# Patient Record
Sex: Male | Born: 1985 | Race: White | Hispanic: No | Marital: Single | Smoking: Former smoker
Health system: Southern US, Community
[De-identification: ages and names within clinical notes are randomized; demographics above are authoritative.]

## PROBLEM LIST (undated history)

## (undated) DIAGNOSIS — M5020 Other cervical disc displacement, unspecified cervical region: Secondary | ICD-10-CM

## (undated) DIAGNOSIS — I10 Essential (primary) hypertension: Secondary | ICD-10-CM

## (undated) DIAGNOSIS — IMO0001 Reserved for inherently not codable concepts without codable children: Secondary | ICD-10-CM

## (undated) DIAGNOSIS — G2581 Restless legs syndrome: Secondary | ICD-10-CM

---
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
## 2005-10-22 ENCOUNTER — Emergency Department (HOSPITAL_COMMUNITY): Admission: EM | Admit: 2005-10-22 | Discharge: 2005-10-22 | Payer: Self-pay | Admitting: Emergency Medicine

## 2012-10-09 ENCOUNTER — Emergency Department (HOSPITAL_COMMUNITY)
Admission: EM | Admit: 2012-10-09 | Discharge: 2012-10-09 | Disposition: A | Discharge status: Home / Self Care | Payer: Worker's Compensation | Attending: Emergency Medicine | Admitting: Emergency Medicine

## 2012-10-09 ENCOUNTER — Encounter (HOSPITAL_COMMUNITY): Payer: Self-pay | Admitting: Nurse Practitioner

## 2012-10-09 DIAGNOSIS — R5381 Other malaise: Secondary | ICD-10-CM | POA: Insufficient documentation

## 2012-10-09 DIAGNOSIS — Y929 Unspecified place or not applicable: Secondary | ICD-10-CM | POA: Insufficient documentation

## 2012-10-09 DIAGNOSIS — R209 Unspecified disturbances of skin sensation: Secondary | ICD-10-CM | POA: Insufficient documentation

## 2012-10-09 DIAGNOSIS — Y9389 Activity, other specified: Secondary | ICD-10-CM | POA: Insufficient documentation

## 2012-10-09 DIAGNOSIS — F17200 Nicotine dependence, unspecified, uncomplicated: Secondary | ICD-10-CM | POA: Insufficient documentation

## 2012-10-09 DIAGNOSIS — S139XXA Sprain of joints and ligaments of unspecified parts of neck, initial encounter: Secondary | ICD-10-CM | POA: Insufficient documentation

## 2012-10-09 DIAGNOSIS — S161XXA Strain of muscle, fascia and tendon at neck level, initial encounter: Secondary | ICD-10-CM

## 2012-10-09 DIAGNOSIS — R5383 Other fatigue: Secondary | ICD-10-CM | POA: Insufficient documentation

## 2012-10-09 DIAGNOSIS — X500XXA Overexertion from strenuous movement or load, initial encounter: Secondary | ICD-10-CM | POA: Insufficient documentation

## 2012-10-09 DIAGNOSIS — IMO0002 Reserved for concepts with insufficient information to code with codable children: Secondary | ICD-10-CM | POA: Insufficient documentation

## 2012-10-09 DIAGNOSIS — S46911A Strain of unspecified muscle, fascia and tendon at shoulder and upper arm level, right arm, initial encounter: Secondary | ICD-10-CM

## 2012-10-09 MED ORDER — IBUPROFEN 800 MG PO TABS: 800.0000 mg | ORAL_TABLET | Freq: Three times a day (TID) | ORAL | Status: DC

## 2012-10-09 NOTE — ED Notes (Signed)
Pt was carrying a ladder and ladder started to fall and twisted his R shoulder trying to hold onto it. C/O r sided neck pain radiating down R arm and R side since. Ambulatory, mae, cms intact

## 2012-10-09 NOTE — ED Notes (Signed)
Pt states that he knows his BP is high. States "i smoke, drink and eat bad"

## 2012-10-09 NOTE — ED Provider Notes (Signed)
Medical screening examination/treatment/procedure(s) were performed by non-physician practitioner and as supervising physician I was immediately available for consultation/collaboration.  Enid Skeens, MD 10/09/12 2146

## 2012-10-09 NOTE — ED Provider Notes (Signed)
History    CSN: [id number] Arrival date & time 10/09/12  1252  First MD Initiated Contact with Patient 10/09/12 1310     Chief Complaint  Patient presents with  . Shoulder Injury   (Consider location/radiation/quality/duration/timing/severity/associated sxs/prior Treatment) HPI Pt is a 27yo male presenting today with acute right sided neck pain and right shoulder pain.  Pt states he was carrying a ladder and started to fall, on his way down, the ladder twisted his right shoulder as he tried to hold onto it.  Pt states immediately after incident, experienced several minutes for burning sensation in right side of shoulder and right shoulder.  Now pain is dull ache, 4/10, feels "heavy" and worse with rotation of head to right. Associated mild numbness in right hand.  Denies hitting head, LOC, open wounds.  Denies bruising or erythema.   History reviewed. No pertinent past medical history. History reviewed. No pertinent past surgical history. History reviewed. No pertinent family history. History  Substance Use Topics  . Smoking status: Current Every Day Smoker    Types: Cigarettes  . Smokeless tobacco: Not on file  . Alcohol Use: Yes    Review of Systems  Constitutional: Negative for fever and chills.  HENT: Positive for neck pain. Negative for neck stiffness.   Musculoskeletal: Positive for myalgias and arthralgias.  Neurological: Positive for weakness ( right shoulder) and numbness ( right hand). Negative for dizziness, light-headedness and headaches.  All other systems reviewed and are negative.    Allergies  Review of patient's allergies indicates no known allergies.  Home Medications   Current Outpatient Rx  Name  Route  Sig  Dispense  Refill  . ibuprofen (ADVIL,MOTRIN) 800 MG tablet   Oral   Take 1 tablet (800 mg total) by mouth 3 (three) times daily.   21 tablet   0    BP 156/96  Pulse 87  Temp(Src) 98.3 F (36.8 C) (Oral)  Resp 16  Ht 5\' 9"  (1.753 m)  Wt  230 lb (104.327 kg)  BMI 33.95 kg/m2  SpO2 99% Physical Exam  Nursing note and vitals reviewed. Constitutional: He appears well-developed and well-nourished.  Pt sitting comfortably in exam bed, NAD.     HENT:  Head: Normocephalic and atraumatic.  Eyes: Conjunctivae are normal. No scleral icterus.  Neck: Normal range of motion. Neck supple.  No midline bone tenderness, no crepitus or step-offs.  TTP in right paraspinal muscle of cervical spine. FROM, pain with rotation to right    Cardiovascular: Normal rate, regular rhythm and normal heart sounds.   Pulmonary/Chest: Effort normal and breath sounds normal. No respiratory distress. He has no wheezes. He has no rales. He exhibits no tenderness.  Musculoskeletal: Normal range of motion. He exhibits tenderness. He exhibits no edema.  TTP right upper trapezius.  FROM right shoulder.  No bony tenderness or deformity.  5/5 strength in major muscle groups in UE.   Neurological: He is alert.  Skin: Skin is warm and dry.  Psychiatric: He has a normal mood and affect. His behavior is normal.    ED Course  Procedures (including critical care time) Labs Reviewed - No data to display No results found. 1. Neck strain, initial encounter   2. Right shoulder strain, initial encounter     MDM  Shoulder strain.  Do not think pt needs imaging at this time.  Rx: ibuprofen.  Advised to use ice today and heating pad tomorrow as he will likely be more stiff and sore.  Will discharge pt home and have her f/u with [facility] info provided. Return precautions given. Pt verbalized understanding and agreement with tx plan. Vitals: unremarkable. Discharged in stable condition.       Junius Finner, PA-C 10/09/12 1353

## 2014-04-10 ENCOUNTER — Encounter (HOSPITAL_COMMUNITY)
Admission: RE | Admit: 2014-04-10 | Discharge: 2014-04-10 | Disposition: A | Discharge status: Home / Self Care | Payer: BLUE CROSS/BLUE SHIELD | Source: Ambulatory Visit | Attending: Orthopedic Surgery | Admitting: Orthopedic Surgery

## 2014-04-10 ENCOUNTER — Ambulatory Visit (HOSPITAL_COMMUNITY)
Admission: RE | Admit: 2014-04-10 | Discharge: 2014-04-10 | Disposition: A | Discharge status: Home / Self Care | Payer: BLUE CROSS/BLUE SHIELD | Source: Ambulatory Visit | Attending: Anesthesiology | Admitting: Anesthesiology

## 2014-04-10 ENCOUNTER — Encounter (HOSPITAL_COMMUNITY): Payer: Self-pay

## 2014-04-10 DIAGNOSIS — Z01818 Encounter for other preprocedural examination: Secondary | ICD-10-CM | POA: Diagnosis present

## 2014-04-10 DIAGNOSIS — M5020 Other cervical disc displacement, unspecified cervical region: Secondary | ICD-10-CM | POA: Diagnosis not present

## 2014-04-10 DIAGNOSIS — Z01811 Encounter for preprocedural respiratory examination: Secondary | ICD-10-CM

## 2014-04-10 HISTORY — DX: Other cervical disc displacement, unspecified cervical region: M50.20

## 2014-04-10 HISTORY — DX: Restless legs syndrome: G25.81

## 2014-04-10 HISTORY — DX: Essential (primary) hypertension: I10

## 2014-04-10 HISTORY — DX: Reserved for inherently not codable concepts without codable children: IMO0001

## 2014-04-10 LAB — CBC
HCT: 47.8 % (ref 39.0–52.0)
Hemoglobin: 17 g/dL (ref 13.0–17.0)
MCH: 30.1 pg (ref 26.0–34.0)
MCHC: 35.6 g/dL (ref 30.0–36.0)
MCV: 84.8 fL (ref 78.0–100.0)
Platelets: 236 10*3/uL (ref 150–400)
RBC: 5.64 MIL/uL (ref 4.22–5.81)
RDW: 12.4 % (ref 11.5–15.5)
WBC: 8.9 10*3/uL (ref 4.0–10.5)

## 2014-04-10 LAB — BASIC METABOLIC PANEL
ANION GAP: 8 (ref 5–15)
BUN: 12 mg/dL (ref 6–23)
CO2: 27 mmol/L (ref 19–32)
CREATININE: 1.06 mg/dL (ref 0.50–1.35)
Calcium: 9.3 mg/dL (ref 8.4–10.5)
Chloride: 105 mEq/L (ref 96–112)
GFR calc Af Amer: >90 mL/min (ref >90)
GFR calc non Af Amer: >90 mL/min (ref >90)
GLUCOSE: 98 mg/dL (ref 70–99)
Potassium: 5.1 mmol/L (ref 3.5–5.1)
SODIUM: 140 mmol/L (ref 135–145)

## 2014-04-10 LAB — ABO/RH: ABO/RH(D): O POS

## 2014-04-10 LAB — SURGICAL PCR SCREEN
MRSA, PCR: NEGATIVE
Staphylococcus aureus: NEGATIVE

## 2014-04-10 LAB — TYPE AND SCREEN
ABO/RH(D): O POS
ANTIBODY SCREEN: NEGATIVE

## 2014-04-10 NOTE — Pre-Procedure Instructions (Addendum)
Johnathan Conrad  04/10/2014   Your procedure is scheduled on: Thursday, April 13, 2014  Report to [facility] Admitting at 9:15 AM.  Call this number if you have problems the morning of surgery: [phone]   Remember: Patient to bring brace on day of procedure   Do not eat food or drink liquids after midnight Wednesday, April 12, 2014    Take these medicines the morning of surgery with A SIP OF WATER: Oxycodone for pain  Stop taking Aspirin, vitamins, and herbal medications. Do not take any NSAIDs ie: Ibuprofen, Advil, Naproxen or any medication containing Aspirin; stop now   Do not wear jewelry, make-up or nail polish.  Do not wear lotions, powders, or perfumes. You may not wear deodorant.  Do not shave 48 hours prior to surgery. Men may shave face and neck.  Do not bring valuables to the hospital.  [facility] is not responsible for any belongings or valuables.               Contacts, dentures or bridgework may not be worn into surgery.  Leave suitcase in the car. After surgery it may be brought to your room.  For patients admitted to the hospital, discharge time is determined by your treatment team.               Patients discharged the day of surgery will not be allowed to drive home.     Special Instructions:  Special Instructions:Special Instructions: [facility] - Preparing for Surgery  Before surgery, you can play an important role.  Because skin is not sterile, your skin needs to be as free of germs as possible.  You can reduce the number of germs on you skin by washing with CHG (chlorahexidine gluconate) soap before surgery.  CHG is an antiseptic cleaner which kills germs and bonds with the skin to continue killing germs even after washing.  Please DO NOT use if you have an allergy to CHG or antibacterial soaps.  If your skin becomes reddened/irritated stop using the CHG and inform your nurse when you arrive at Short Stay.  Do not shave (including legs  and underarms) for at least 48 hours prior to the first CHG shower.  You may shave your face.  Please follow these instructions carefully:   1.  Shower with CHG Soap the night before surgery and the morning of Surgery.  2.  If you choose to wash your hair, wash your hair first as usual with your normal shampoo.  3.  After you shampoo, rinse your hair and body thoroughly to remove the Shampoo.  4.  Use CHG as you would any other liquid soap.  You can apply chg directly  to the skin and wash gently with scrungie or a clean washcloth.  5.  Apply the CHG Soap to your body ONLY FROM THE NECK DOWN.  Do not use on open wounds or open sores.  Avoid contact with your eyes, ears, mouth and genitals (private parts).  Wash genitals (private parts) with your normal soap.  6.  Wash thoroughly, paying special attention to the area where your surgery will be performed.  7.  Thoroughly rinse your body with warm water from the neck down.  8.  DO NOT shower/wash with your normal soap after using and rinsing off the CHG Soap.  9.  Pat yourself dry with a clean towel.            10.  Wear clean pajamas.              11.  Place clean sheets on your bed the night of your first shower and do not sleep with pets.  Day of Surgery  Do not apply any lotions/deodorants the morning of surgery.  Please wear clean clothes to the hospital/surgery center.   Please read over the following fact sheets that you were given: Pain Booklet, Coughing and Deep Breathing, Blood Transfusion Information, MRSA Information and Surgical Site Infection Prevention

## 2014-04-10 NOTE — Pre-Procedure Instructions (Signed)
Johnathan Conrad  04/10/2014   Your procedure is scheduled on: Thursday, April 13, 2014  [facility] at 9:15 AM.  Call this number if you have problems the morning of surgery: [phone]   Remember: Patient to bring brace on day of procedure   Do not eat food or drink liquids after midnight Wednesday, April 12, 2014    Take these medicines the morning of surgery with A SIP OF WATER: Oxycodone for pain  Stop taking Aspirin, vitamins, and herbal medications. Do not take any NSAIDs ie: Ibuprofen, Advil, Naproxen or any medication containing Aspirin; stop now   Do not wear jewelry, make-up or nail polish.  Do not wear lotions, powders, or perfumes. You may not wear deodorant.  Do not shave 48 hours prior to surgery. Men may shave face and neck.  Do not bring valuables to the hospital.  [facility] is not [facility] for any belongings or valuables.               Contacts, dentures or bridgework may not be worn into surgery.  Leave suitcase in the car. After surgery it may be brought to your room.  For patients admitted to the hospital, discharge time is determined by your treatment team.               Patients discharged the day of surgery will not be allowed to drive home.     Special Instructions:  Special Instructions:Special Instructions: [place] - Preparing for Surgery  Before surgery, you can play an important role.  Because skin is not sterile, your skin needs to be as free of germs as possible.  You can reduce the number of germs on you skin by washing with CHG (chlorahexidine gluconate) soap before surgery.  CHG is an antiseptic cleaner which kills germs and bonds with the skin to continue killing germs even after washing.  Please DO NOT use if you have an allergy to CHG or antibacterial soaps.  If your skin becomes reddened/irritated stop using the CHG and inform your nurse when you arrive at Short Stay.  Do not shave (including legs  and underarms) for at least 48 hours prior to the first CHG shower.  You may shave your face.  Please follow these instructions carefully:   1.  Shower with CHG Soap the night before surgery and the morning of Surgery.  2.  If you choose to wash your hair, wash your hair first as usual with your normal shampoo.  3.  After you shampoo, rinse your hair and body thoroughly to remove the Shampoo.  4.  Use CHG as you would any other liquid soap.  You can apply chg directly  to the skin and wash gently with scrungie or a clean washcloth.  5.  Apply the CHG Soap to your body ONLY FROM THE NECK DOWN.  Do not use on open wounds or open sores.  Avoid contact with your eyes, ears, mouth and genitals (private parts).  Wash genitals (private parts) with your normal soap.  6.  Wash thoroughly, paying special attention to the area where your surgery will be performed.  7.  Thoroughly rinse your body with warm water from the neck down.  8.  DO NOT shower/wash with your normal soap after using and rinsing off the CHG Soap.  9.  Pat yourself dry with a clean towel.            10.  Wear clean pajamas.  11.  Place clean sheets on your bed the night of your first shower and do not sleep with pets.  Day of Surgery  Do not apply any lotions/deodorants the morning of surgery.  Please wear clean clothes to the hospital/surgery center.   Please read over the following fact sheets that you were given: Pain Booklet, Coughing and Deep Breathing, Blood Transfusion Information, MRSA Information and Surgical Site Infection Prevention

## 2014-04-10 NOTE — Progress Notes (Signed)
   04/10/14 0953  OBSTRUCTIVE SLEEP APNEA  Have you ever been diagnosed with sleep apnea through a sleep study? No  Do you snore loudly (loud enough to be heard through closed doors)?  1  Do you often feel tired, fatigued, or sleepy during the daytime? 1  Has anyone observed you stop breathing during your sleep? 0  Do you have, or are you being treated for high blood pressure? 1  BMI more than 35 kg/m2? 1  Age over 31 years old? 0  Neck circumference greater than 40 cm/16 inches? 1  Gender: 1  Obstructive Sleep Apnea Score 6  Score 4 or greater  No PCP

## 2014-04-11 NOTE — Progress Notes (Signed)
Anesthesia Chart Review:  Pt is 29 year old male scheduled for  C7 corpectomy, C6-T1 fusion, posterior spinal interbody fusion on 04/13/2014 with Dr. Shon BatonBrooks.   PMH includes: HTN. Former smoker. BMI 38.6  Preoperative labs reviewed.    Chest x-ray reviewed. Bibasilar subsegmental atelectasis. Exam otherwise unremarkable.   EKG 02/24/2014: Sinus rhythm. Borderline LAD.   If no changes, I anticipate pt can proceed with surgery as scheduled.   Rica Mastngela Elna Radovich, FNP-BC [facility]/Anesthesiology Phone: [phone] 04/11/2014 2:50 PM

## 2014-04-12 MED ORDER — DEXAMETHASONE SODIUM PHOSPHATE 4 MG/ML IJ SOLN
10.0000 mg | Freq: Once | INTRAMUSCULAR | Status: AC
  Administered 2014-04-13: 10 mg via INTRAVENOUS
  Filled 2014-04-12: qty 3

## 2014-04-12 MED ORDER — ACETAMINOPHEN 10 MG/ML IV SOLN
1000.0000 mg | Freq: Four times a day (QID) | INTRAVENOUS | Status: DC
  Filled 2014-04-12: qty 100

## 2014-04-12 MED ORDER — VANCOMYCIN HCL 10 G IV SOLR
1500.0000 mg | INTRAVENOUS | Status: AC
  Administered 2014-04-13: 1500 mg via INTRAVENOUS
  Filled 2014-04-12: qty 1500

## 2014-04-12 NOTE — Progress Notes (Signed)
Spoke with pt and informed him that surgery time had changed and he was to arrive at 0800 with all other instructions remaining the same.  States understanding.

## 2014-04-13 ENCOUNTER — Observation Stay (HOSPITAL_COMMUNITY)
Admission: RE | Admit: 2014-04-13 | Discharge: 2014-04-14 | Disposition: A | Discharge status: Home / Self Care | Payer: BLUE CROSS/BLUE SHIELD | Source: Ambulatory Visit | Attending: Orthopedic Surgery | Admitting: Orthopedic Surgery

## 2014-04-13 ENCOUNTER — Encounter (HOSPITAL_COMMUNITY): Payer: Self-pay

## 2014-04-13 ENCOUNTER — Inpatient Hospital Stay (HOSPITAL_COMMUNITY): Payer: BLUE CROSS/BLUE SHIELD | Admitting: Emergency Medicine

## 2014-04-13 ENCOUNTER — Observation Stay (HOSPITAL_COMMUNITY): Payer: BLUE CROSS/BLUE SHIELD

## 2014-04-13 ENCOUNTER — Inpatient Hospital Stay (HOSPITAL_COMMUNITY): Payer: BLUE CROSS/BLUE SHIELD

## 2014-04-13 ENCOUNTER — Inpatient Hospital Stay (HOSPITAL_COMMUNITY): Payer: BLUE CROSS/BLUE SHIELD | Admitting: Certified Registered Nurse Anesthetist

## 2014-04-13 ENCOUNTER — Encounter (HOSPITAL_COMMUNITY)
Admission: RE | Disposition: A | Discharge status: Home / Self Care | Payer: Self-pay | Source: Ambulatory Visit | Attending: Orthopedic Surgery

## 2014-04-13 DIAGNOSIS — Z419 Encounter for procedure for purposes other than remedying health state, unspecified: Secondary | ICD-10-CM

## 2014-04-13 DIAGNOSIS — G2581 Restless legs syndrome: Secondary | ICD-10-CM | POA: Insufficient documentation

## 2014-04-13 DIAGNOSIS — M5012 Cervical disc disorder with radiculopathy, mid-cervical region: Principal | ICD-10-CM | POA: Insufficient documentation

## 2014-04-13 DIAGNOSIS — Z981 Arthrodesis status: Secondary | ICD-10-CM

## 2014-04-13 DIAGNOSIS — Z87891 Personal history of nicotine dependence: Secondary | ICD-10-CM | POA: Diagnosis not present

## 2014-04-13 DIAGNOSIS — R0600 Dyspnea, unspecified: Secondary | ICD-10-CM | POA: Insufficient documentation

## 2014-04-13 DIAGNOSIS — M5010 Cervical disc disorder with radiculopathy, unspecified cervical region: Secondary | ICD-10-CM | POA: Diagnosis present

## 2014-04-13 DIAGNOSIS — Z6838 Body mass index (BMI) 38.0-38.9, adult: Secondary | ICD-10-CM | POA: Diagnosis not present

## 2014-04-13 DIAGNOSIS — I10 Essential (primary) hypertension: Secondary | ICD-10-CM | POA: Diagnosis not present

## 2014-04-13 HISTORY — PX: ANTERIOR AND POSTERIOR SPINAL FUSION: SHX2259

## 2014-04-13 SURGERY — ANTERIOR AND POSTERIOR SPINAL FUSION
Anesthesia: General | Site: Neck

## 2014-04-13 MED ORDER — THROMBIN 20000 UNITS EX SOLR
CUTANEOUS | Status: AC
  Filled 2014-04-13: qty 20000

## 2014-04-13 MED ORDER — SODIUM CHLORIDE 0.9 % IJ SOLN
3.0000 mL | Freq: Two times a day (BID) | INTRAMUSCULAR | Status: DC
  Administered 2014-04-13: 3 mL via INTRAVENOUS

## 2014-04-13 MED ORDER — LACTATED RINGERS IV SOLN: INTRAVENOUS | Status: DC

## 2014-04-13 MED ORDER — DEXAMETHASONE SODIUM PHOSPHATE 4 MG/ML IJ SOLN
6.0000 mg | Freq: Four times a day (QID) | INTRAMUSCULAR | Status: AC
  Administered 2014-04-14: 6 mg via INTRAVENOUS
  Filled 2014-04-13 (×3): qty 1.5

## 2014-04-13 MED ORDER — METHOCARBAMOL 500 MG PO TABS
500.0000 mg | ORAL_TABLET | Freq: Four times a day (QID) | ORAL | Status: DC | PRN
  Administered 2014-04-13 – 2014-04-14 (×2): 500 mg via ORAL
  Filled 2014-04-13 (×3): qty 1

## 2014-04-13 MED ORDER — VANCOMYCIN HCL IN DEXTROSE 1-5 GM/200ML-% IV SOLN
INTRAVENOUS | Status: AC
  Filled 2014-04-13: qty 200

## 2014-04-13 MED ORDER — METHOCARBAMOL 1000 MG/10ML IJ SOLN
500.0000 mg | Freq: Four times a day (QID) | INTRAVENOUS | Status: DC | PRN
  Filled 2014-04-13: qty 5

## 2014-04-13 MED ORDER — HYDROMORPHONE HCL 1 MG/ML IJ SOLN
0.2500 mg | INTRAMUSCULAR | Status: DC | PRN
  Administered 2014-04-13: 0.5 mg via INTRAVENOUS

## 2014-04-13 MED ORDER — MORPHINE SULFATE 2 MG/ML IJ SOLN
1.0000 mg | INTRAMUSCULAR | Status: DC | PRN
  Administered 2014-04-13 – 2014-04-14 (×2): 2 mg via INTRAVENOUS
  Filled 2014-04-13 (×2): qty 1

## 2014-04-13 MED ORDER — PROPOFOL 10 MG/ML IV BOLUS
INTRAVENOUS | Status: DC | PRN
  Administered 2014-04-13: 200 mg via INTRAVENOUS

## 2014-04-13 MED ORDER — ROCURONIUM BROMIDE 100 MG/10ML IV SOLN
INTRAVENOUS | Status: DC | PRN
  Administered 2014-04-13: 50 mg via INTRAVENOUS
  Administered 2014-04-13: 10 mg via INTRAVENOUS
  Administered 2014-04-13 (×2): 20 mg via INTRAVENOUS

## 2014-04-13 MED ORDER — DEXAMETHASONE 4 MG PO TABS
4.0000 mg | ORAL_TABLET | Freq: Four times a day (QID) | ORAL | Status: AC
  Administered 2014-04-13 (×2): 4 mg via ORAL
  Filled 2014-04-13 (×3): qty 1

## 2014-04-13 MED ORDER — PROPOFOL 10 MG/ML IV BOLUS
INTRAVENOUS | Status: AC
  Filled 2014-04-13: qty 20

## 2014-04-13 MED ORDER — ACETAMINOPHEN 10 MG/ML IV SOLN: 1000.0000 mg | Freq: Four times a day (QID) | INTRAVENOUS | Status: DC

## 2014-04-13 MED ORDER — ROCURONIUM BROMIDE 50 MG/5ML IV SOLN
INTRAVENOUS | Status: AC
  Filled 2014-04-13: qty 1

## 2014-04-13 MED ORDER — FENTANYL CITRATE 0.05 MG/ML IJ SOLN
INTRAMUSCULAR | Status: AC
  Filled 2014-04-13: qty 5

## 2014-04-13 MED ORDER — DOCUSATE SODIUM 100 MG PO CAPS: 100.0000 mg | ORAL_CAPSULE | Freq: Three times a day (TID) | ORAL | Status: AC | PRN

## 2014-04-13 MED ORDER — ONDANSETRON HCL 4 MG/2ML IJ SOLN
4.0000 mg | INTRAMUSCULAR | Status: DC | PRN
  Administered 2014-04-14: 4 mg via INTRAVENOUS
  Filled 2014-04-13: qty 2

## 2014-04-13 MED ORDER — ONDANSETRON HCL 4 MG/2ML IJ SOLN
INTRAMUSCULAR | Status: AC
  Filled 2014-04-13: qty 2

## 2014-04-13 MED ORDER — METHOCARBAMOL 500 MG PO TABS
ORAL_TABLET | ORAL | Status: AC
  Filled 2014-04-13: qty 1

## 2014-04-13 MED ORDER — MENTHOL 3 MG MT LOZG
1.0000 | LOZENGE | OROMUCOSAL | Status: DC | PRN
  Filled 2014-04-13: qty 9

## 2014-04-13 MED ORDER — OXYCODONE HCL 5 MG PO TABS
10.0000 mg | ORAL_TABLET | ORAL | Status: DC | PRN
  Administered 2014-04-13 – 2014-04-14 (×4): 10 mg via ORAL
  Filled 2014-04-13 (×3): qty 2

## 2014-04-13 MED ORDER — GELATIN ABSORBABLE MT POWD
OROMUCOSAL | Status: DC | PRN
  Administered 2014-04-13: 11:00:00 via TOPICAL

## 2014-04-13 MED ORDER — METHOCARBAMOL 500 MG PO TABS: 500.0000 mg | ORAL_TABLET | Freq: Three times a day (TID) | ORAL | Status: AC | PRN

## 2014-04-13 MED ORDER — LACTATED RINGERS IV SOLN
INTRAVENOUS | Status: DC | PRN
  Administered 2014-04-13: 09:00:00 via INTRAVENOUS

## 2014-04-13 MED ORDER — BUPIVACAINE-EPINEPHRINE (PF) 0.25% -1:200000 IJ SOLN
INTRAMUSCULAR | Status: AC
  Filled 2014-04-13: qty 30

## 2014-04-13 MED ORDER — BUPIVACAINE-EPINEPHRINE 0.25% -1:200000 IJ SOLN
INTRAMUSCULAR | Status: DC | PRN
  Administered 2014-04-13: 7 mL

## 2014-04-13 MED ORDER — OXYCODONE HCL 5 MG PO TABS
ORAL_TABLET | ORAL | Status: AC
  Filled 2014-04-13: qty 2

## 2014-04-13 MED ORDER — GLYCOPYRROLATE 0.2 MG/ML IJ SOLN
INTRAMUSCULAR | Status: DC | PRN
  Administered 2014-04-13: 0.6 mg via INTRAVENOUS

## 2014-04-13 MED ORDER — ACETAMINOPHEN 10 MG/ML IV SOLN
INTRAVENOUS | Status: DC | PRN
  Administered 2014-04-13: 1000 mg via INTRAVENOUS

## 2014-04-13 MED ORDER — MIDAZOLAM HCL 5 MG/5ML IJ SOLN
INTRAMUSCULAR | Status: DC | PRN
  Administered 2014-04-13: 2 mg via INTRAVENOUS

## 2014-04-13 MED ORDER — LIDOCAINE HCL (CARDIAC) 20 MG/ML IV SOLN
INTRAVENOUS | Status: DC | PRN
  Administered 2014-04-13: 80 mg via INTRAVENOUS

## 2014-04-13 MED ORDER — CEFAZOLIN SODIUM 1-5 GM-% IV SOLN
1.0000 g | Freq: Four times a day (QID) | INTRAVENOUS | Status: AC
  Administered 2014-04-13 – 2014-04-14 (×2): 1 g via INTRAVENOUS
  Filled 2014-04-13 (×2): qty 50

## 2014-04-13 MED ORDER — FENTANYL CITRATE 0.05 MG/ML IJ SOLN
INTRAMUSCULAR | Status: DC | PRN
Start: 2014-04-13 — End: 2014-04-13
  Administered 2014-04-13 (×3): 50 ug via INTRAVENOUS
  Administered 2014-04-13: 100 ug via INTRAVENOUS
  Administered 2014-04-13: 50 ug via INTRAVENOUS
  Administered 2014-04-13: 100 ug via INTRAVENOUS
  Administered 2014-04-13: 50 ug via INTRAVENOUS

## 2014-04-13 MED ORDER — MEPERIDINE HCL 25 MG/ML IJ SOLN: 6.2500 mg | INTRAMUSCULAR | Status: DC | PRN

## 2014-04-13 MED ORDER — PROMETHAZINE HCL 25 MG/ML IJ SOLN: 6.2500 mg | INTRAMUSCULAR | Status: DC | PRN

## 2014-04-13 MED ORDER — SODIUM CHLORIDE 0.9 % IV SOLN
250.0000 mL | INTRAVENOUS | Status: DC
  Administered 2014-04-14: 250 mL via INTRAVENOUS

## 2014-04-13 MED ORDER — NEOSTIGMINE METHYLSULFATE 10 MG/10ML IV SOLN
INTRAVENOUS | Status: DC | PRN
  Administered 2014-04-13: 4 mg via INTRAVENOUS

## 2014-04-13 MED ORDER — LACTATED RINGERS IV SOLN
INTRAVENOUS | Status: DC | PRN
  Administered 2014-04-13 (×2): via INTRAVENOUS

## 2014-04-13 MED ORDER — MIDAZOLAM HCL 2 MG/2ML IJ SOLN
INTRAMUSCULAR | Status: AC
  Filled 2014-04-13: qty 2

## 2014-04-13 MED ORDER — LIDOCAINE HCL (CARDIAC) 20 MG/ML IV SOLN
INTRAVENOUS | Status: AC
  Filled 2014-04-13: qty 5

## 2014-04-13 MED ORDER — SODIUM CHLORIDE 0.9 % IJ SOLN: 3.0000 mL | INTRAMUSCULAR | Status: DC | PRN

## 2014-04-13 MED ORDER — PHENOL 1.4 % MT LIQD: 1.0000 | OROMUCOSAL | Status: DC | PRN

## 2014-04-13 MED ORDER — HYDROMORPHONE HCL 1 MG/ML IJ SOLN
INTRAMUSCULAR | Status: AC
  Filled 2014-04-13: qty 1

## 2014-04-13 MED ORDER — ONDANSETRON HCL 4 MG PO TABS: 4.0000 mg | ORAL_TABLET | Freq: Three times a day (TID) | ORAL | Status: AC | PRN

## 2014-04-13 MED ORDER — OXYCODONE-ACETAMINOPHEN 10-325 MG PO TABS: 1.0000 | ORAL_TABLET | ORAL | Status: AC | PRN

## 2014-04-13 MED ORDER — ACETAMINOPHEN 500 MG PO TABS
1000.0000 mg | ORAL_TABLET | Freq: Four times a day (QID) | ORAL | Status: AC
  Administered 2014-04-13 – 2014-04-14 (×4): 1000 mg via ORAL
  Filled 2014-04-13 (×4): qty 2

## 2014-04-13 MED ORDER — LACTATED RINGERS IV SOLN
INTRAVENOUS | Status: DC
  Administered 2014-04-13: 09:00:00 via INTRAVENOUS

## 2014-04-13 SURGICAL SUPPLY — 95 items
ADH SKN CLS APL DERMABOND .7 (GAUZE/BANDAGES/DRESSINGS) ×1
APPLIER CLIP 11 MED OPEN (CLIP)
APR CLP MED 11 20 MLT OPN (CLIP)
BIT DRILL QC 3.2 195 (BIT)
BIT DRILL QC 3.2 195MM (BIT) IMPLANT
BLADE SURG 10 STRL SS (BLADE) ×3 IMPLANT
BLADE SURG ROTATE 9660 (MISCELLANEOUS) ×2 IMPLANT
BUR EGG ELITE 4.0 (BURR) ×2 IMPLANT
BUR EGG ELITE 4.0MM (BURR) ×1
BUR MATCHSTICK NEURO 3.0X3.8 (BURR) ×2 IMPLANT
CLIP APPLIE 11 MED OPEN (CLIP) IMPLANT
CLOSURE STERI-STRIP 1/2X4 (GAUZE/BANDAGES/DRESSINGS) ×1
CLOSURE WOUND 1/2 X4 (GAUZE/BANDAGES/DRESSINGS) ×4
CLSR STERI-STRIP ANTIMIC 1/2X4 (GAUZE/BANDAGES/DRESSINGS) ×1 IMPLANT
CORDS BIPOLAR (ELECTRODE) ×6 IMPLANT
COVER BACK TABLE 24X17X13 BIG (DRAPES) ×3 IMPLANT
COVER MAYO STAND STRL (DRAPES) ×2 IMPLANT
COVER SURGICAL LIGHT HANDLE (MISCELLANEOUS) ×3 IMPLANT
DERMABOND ADVANCED (GAUZE/BANDAGES/DRESSINGS) ×2
DERMABOND ADVANCED .7 DNX12 (GAUZE/BANDAGES/DRESSINGS) ×1 IMPLANT
DRAIN CHANNEL 15F RND FF W/TCR (WOUND CARE) IMPLANT
DRAPE C-ARM 42X72 X-RAY (DRAPES) ×6 IMPLANT
DRAPE INCISE IOBAN 66X45 STRL (DRAPES) ×3 IMPLANT
DRAPE LAPAROTOMY T 102X78X121 (DRAPES) ×3 IMPLANT
DRAPE SURG 17X23 STRL (DRAPES) ×6 IMPLANT
DRAPE U-SHAPE 47X51 STRL (DRAPES) ×6 IMPLANT
DRILL BIT QC 3.2 195MM (BIT)
DRSG MEPILEX BORDER 4X4 (GAUZE/BANDAGES/DRESSINGS) ×2 IMPLANT
DRSG MEPILEX BORDER 4X8 (GAUZE/BANDAGES/DRESSINGS) ×3 IMPLANT
DURAPREP 26ML APPLICATOR (WOUND CARE) ×6 IMPLANT
ELECT BLADE 4.0 EZ CLEAN MEGAD (MISCELLANEOUS) ×3
ELECT CAUTERY BLADE 6.4 (BLADE) ×3 IMPLANT
ELECT PENCIL ROCKER SW 15FT (MISCELLANEOUS) ×3 IMPLANT
ELECT REM PT RETURN 9FT ADLT (ELECTROSURGICAL) ×6
ELECTRODE BLDE 4.0 EZ CLN MEGD (MISCELLANEOUS) ×1 IMPLANT
ELECTRODE REM PT RTRN 9FT ADLT (ELECTROSURGICAL) ×2 IMPLANT
ENDOSKELETON LG TC 6VBR 8MM (Orthopedic Implant) ×2 IMPLANT
EVACUATOR 1/8 PVC DRAIN (DRAIN) IMPLANT
GAUZE SPONGE 4X4 16PLY XRAY LF (GAUZE/BANDAGES/DRESSINGS) ×3 IMPLANT
GLOVE BIOGEL PI IND STRL 8 (GLOVE) ×1 IMPLANT
GLOVE BIOGEL PI INDICATOR 8 (GLOVE) ×2
GLOVE ECLIPSE 8.5 STRL (GLOVE) ×3 IMPLANT
GLOVE ORTHO TXT STRL SZ7.5 (GLOVE) ×3 IMPLANT
GLOVE SS BIOGEL STRL SZ 8 (GLOVE) ×1 IMPLANT
GLOVE SUPERSENSE BIOGEL SZ 8 (GLOVE) ×2
GOWN STRL REUS W/ TWL LRG LVL3 (GOWN DISPOSABLE) ×1 IMPLANT
GOWN STRL REUS W/TWL 2XL LVL3 (GOWN DISPOSABLE) ×9 IMPLANT
GOWN STRL REUS W/TWL LRG LVL3 (GOWN DISPOSABLE) ×3
HEMOSTAT SNOW SURGICEL 2X4 (HEMOSTASIS) IMPLANT
KIT BASIN OR (CUSTOM PROCEDURE TRAY) ×3 IMPLANT
KIT POSITION SURG JACKSON T1 (MISCELLANEOUS) ×3 IMPLANT
KIT ROOM TURNOVER OR (KITS) ×3 IMPLANT
MARKER SKIN DUAL TIP RULER LAB (MISCELLANEOUS) ×3 IMPLANT
NDL SPNL 18GX3.5 QUINCKE PK (NEEDLE) IMPLANT
NDL SPNL 22GX3.5 QUINCKE BK (NEEDLE) ×1 IMPLANT
NEEDLE SPNL 18GX3.5 QUINCKE PK (NEEDLE) ×3 IMPLANT
NEEDLE SPNL 22GX3.5 QUINCKE BK (NEEDLE) ×3 IMPLANT
NS IRRIG 1000ML POUR BTL (IV SOLUTION) ×3 IMPLANT
PACK LAMINECTOMY ORTHO (CUSTOM PROCEDURE TRAY) ×3 IMPLANT
PACK UNIVERSAL I (CUSTOM PROCEDURE TRAY) ×6 IMPLANT
PAD ARMBOARD 7.5X6 YLW CONV (MISCELLANEOUS) ×6 IMPLANT
PAD SHARPS MAGNETIC DISPOSAL (MISCELLANEOUS) IMPLANT
PATTIES SURGICAL .5 X1 (DISPOSABLE) ×9 IMPLANT
PATTIES SURGICAL .75X.75 (GAUZE/BANDAGES/DRESSINGS) IMPLANT
PENCIL BUTTON HOLSTER BLD 10FT (ELECTRODE) ×3 IMPLANT
PIN RETAINER PRODISC 14 MM (PIN) ×4 IMPLANT
PLATE ONE LEVEL SKYLINE 14MM (Plate) ×2 IMPLANT
PUTTY BONE DBX 2.5 MIS (Bone Implant) ×2 IMPLANT
SCREW SKYLINE 14MM SD-VA (Screw) ×8 IMPLANT
SPONGE INTESTINAL PEANUT (DISPOSABLE) ×6 IMPLANT
SPONGE SURGIFOAM ABS GEL 100 (HEMOSTASIS) IMPLANT
STRIP CLOSURE SKIN 1/2X4 (GAUZE/BANDAGES/DRESSINGS) ×8 IMPLANT
SURGIFLO TRUKIT (HEMOSTASIS) IMPLANT
SUT BONE WAX W31G (SUTURE) ×3 IMPLANT
SUT ETHILON 2 0 FS 18 (SUTURE) ×3 IMPLANT
SUT MON AB 3-0 SH 27 (SUTURE) ×3
SUT MON AB 3-0 SH27 (SUTURE) ×1 IMPLANT
SUT PDS AB 1 CTX 36 (SUTURE) ×3 IMPLANT
SUT SILK 2 0 TIES 10X30 (SUTURE) ×3 IMPLANT
SUT SILK 3 0 TIES 10X30 (SUTURE) ×3 IMPLANT
SUT VIC AB 0 CT1 27 (SUTURE) ×3
SUT VIC AB 0 CT1 27XBRD ANBCTR (SUTURE) ×1 IMPLANT
SUT VIC AB 1 CTX 36 (SUTURE) ×6
SUT VIC AB 1 CTX36XBRD ANBCTR (SUTURE) ×2 IMPLANT
SUT VIC AB 2-0 CT1 18 (SUTURE) ×3 IMPLANT
SUT VIC AB 3-0 SH 27 (SUTURE)
SUT VIC AB 3-0 SH 27XBRD (SUTURE) IMPLANT
SUT VICRYL 0 UR6 27IN ABS (SUTURE) ×3 IMPLANT
SYR BULB IRRIGATION 50ML (SYRINGE) ×3 IMPLANT
SYR CONTROL 10ML LL (SYRINGE) ×3 IMPLANT
TOWEL OR 17X24 6PK STRL BLUE (TOWEL DISPOSABLE) ×3 IMPLANT
TOWEL OR 17X26 10 PK STRL BLUE (TOWEL DISPOSABLE) ×6 IMPLANT
TRAY FOLEY CATH 16FR SILVER (SET/KITS/TRAYS/PACK) ×3 IMPLANT
WATER STERILE IRR 1000ML POUR (IV SOLUTION) ×3 IMPLANT
YANKAUER SUCT BULB TIP NO VENT (SUCTIONS) ×3 IMPLANT

## 2014-04-13 NOTE — Anesthesia Procedure Notes (Signed)
Procedure Name: Intubation Date/Time: 04/13/2014 9:53 AM Performed by: Rogelia BogaMUELLER, Ossie Yebra P Pre-anesthesia Checklist: Patient identified, Emergency Drugs available, Suction available, Patient being monitored and Timeout performed Patient Re-evaluated:Patient Re-evaluated prior to inductionOxygen Delivery Method: Circle system utilized Preoxygenation: Pre-oxygenation with 100% oxygen Intubation Type: IV induction Ventilation: Mask ventilation without difficulty and Oral airway inserted - appropriate to patient size Laryngoscope Size: Mac and 4 Grade View: Grade II Tube type: Oral Tube size: 7.5 mm Number of attempts: 1 Airway Equipment and Method: Stylet Placement Confirmation: ETT inserted through vocal cords under direct vision,  positive ETCO2 and breath sounds checked- equal and bilateral Secured at: 22 cm Tube secured with: Tape Dental Injury: Teeth and Oropharynx as per pre-operative assessment

## 2014-04-13 NOTE — Anesthesia Preprocedure Evaluation (Addendum)
Anesthesia Evaluation  Patient identified by MRN, date of birth, ID band Patient awake    Reviewed: Allergy & Precautions, NPO status , Patient's Chart, lab work & pertinent test results  Airway Mallampati: II  TM Distance: >3 FB Neck ROM: Full    Dental no notable dental hx. (+) Teeth Intact, Dental Advisory Given   Pulmonary shortness of breath, former smoker,  breath sounds clear to auscultation  Pulmonary exam normal       Cardiovascular hypertension, Rhythm:Regular Rate:Normal     Neuro/Psych negative neurological ROS  negative psych ROS   GI/Hepatic negative GI ROS, Neg liver ROS,   Endo/Other  Morbid obesity  Renal/GU negative Renal ROS     Musculoskeletal negative musculoskeletal ROS (+)   Abdominal (+) + obese,   Peds  Hematology negative hematology ROS (+)   Anesthesia Other Findings   Reproductive/Obstetrics negative OB ROS                           Anesthesia Physical Anesthesia Plan  ASA: II  Anesthesia Plan: General   Post-op Pain Management:    Induction: Intravenous  Airway Management Planned: Oral ETT  Additional Equipment: Arterial line  Intra-op Plan:   Post-operative Plan: Extubation in OR  Informed Consent: I have reviewed the patients History and Physical, chart, labs and discussed the procedure including the risks, benefits and alternatives for the proposed anesthesia with the patient or authorized representative who has indicated his/her understanding and acceptance.   Dental advisory given  Plan Discussed with: CRNA, Anesthesiologist and Surgeon  Anesthesia Plan Comments: (2 x PIV)      Anesthesia Quick Evaluation

## 2014-04-13 NOTE — H&P (Signed)
History of Present Illness   The patient is a 29 year old male who presents with neck pain. The patient is seen today in referral from Dr Ethelene Hal. The patient reports symptoms involving the entire neck (left scapula to the forearm to 5th finger) which began 2 month(s) ago. The symptoms began without any known injury ("but I did fall on 03/06/14 but I already had the pain and numbness"). Symptoms include neck stiffness, muscle spasm (left scapula), shoulder pain, numbness (left arm from the posterior elbow to fingers ring and small), tingling and weakness, while symptoms do not include headaches.The patient describes their symptoms as moderate in severity.The patient does feel that the symptoms are worsening. Symptoms are exacerbated by turning the head to the right and turning the head to the left. Current treatment includes nonsteroidal anti-inflammatory drugs (Ibuprofen). Prior to being seen today the patient was previously evaluated in urgent care. Past evaluation has included cervical spine MRI (c-spine @ [facility]). Past treatment has included nonsteroidal anti-inflammatory drugs and opioid analgesics.  Additional reasons for visit:  Transition into care is described as the following: The patient is transitioning into care from another physician and a summary of care was reviewed .  Allergies No Known Drug Allergies12/16/2015  Family History  No pertinent family history First Degree Relatives reported  Social History  Not under pain contract No history of drug/alcohol rehab Number of flights of stairs before winded greater than 5 Tobacco use Current every day smoker. 03/08/2014: smoke(d) 1 pack(s) per day uses less than 1/2 can(s) smokeless per week Tobacco / smoke exposure 03/08/2014: no Marital status single Current drinker 03/08/2014: Currently drinks beer, wine and hard liquor 8-14 times per week Children 0 Current work status working full time Living situation live  alone Exercise Exercises never; does other  Medication History Ibuprofen (  Tablet, Oral) Active. (prn) Medications Reconciled  Vitals 03/28/2014 2:07 PM Weight: 253 lb Height: 69in Body Surface Area: 2.28 m Body Mass Index: 37.36 kg/m   Objective Transcription  He returns today for follow up. Clinically he continues to have significant C8 and C7 radiculopathy on the left side. He has weakness of the triceps grip strength. He has diminished sensation in the C7 and C8 distribution on the left side. Negative Babinski test. No clonus. No significant shoulder, elbow or wrist pain with joint range of motion. Horrific neck pain with range of motion radiating mostly into the left scapular. No shortness of breath or chest pain. The abdomen is soft, nontender. Intact peripheral pulses throughout. Lungs are clear to auscultation. Heart regular rate and rhythm.  He is a pleasant gentleman who appears his stated age in no acute distress. He is alert and oriented x3. He has had a one week history of progressive left arm weakness, grip strength weakness, and neck pain. He has positive Spurling's sign on the left side. Numbness and dysesthesias in the C7 and C8 distribution. 3+/5 grip strength on the left side, 4/5 triceps strength on the left side. The rest is 5/5. He has pain with cervical spine range of motion. No shortness of breath or chest pain. Right upper extremity is 5/5. Negative Babinski test. 1+ triceps reflex on the left side. Bicep and brachial radialis is 2+ on the left. On the right side he has 2+ reflexes, biceps, triceps, and brachial radialis. Negative Babinski test. Negative Hoffman sign.  RADIOGRAPHS: MRI from 03/10/14 shows no cord signal changes. He has a large C6-C7 disc herniation which extends to about the mid part  of the C7 vertebral body. It is a large sequestered fragment that is at the mid point of the C7 vertebral body. He has a small marginal spur at  3-4 which could be getting some mass effect on the 4 nerve root and there is severe left foraminal stenosis but it is not as significant as the 6-7 findings.   Assessment & Plan   At this point in time I have gone over the surgery with the patient and his girlfriend. I have shown him where this disc herniation is. At this point I am quite concerned that my ability to remove the whole fracture through an ACDF only is significantly compromised. The large fracture of this disc herniation is sitting behind the mid part of the C7 vertebral body and I am concerned that I would not be able to get that out through an ACDF only. If I was able to get a significant piece of disc herniation consistent with what I have seen on the preoperative MRI out through an ACDF then intraoperatively I would just do the ACDF. However, if I am not convinced that all of the disc material has been removed then I would defer to doing a corpectomy at C7 and then doing an instrumented C6 to T1 fusion. Because of the transition between mobile and fixed segment cervical thoracic junction and his size and lifestyle, I would most likely want to supplement this with a posterior fixation. I have discussed this with the patient and his significant other. All of their questions were addressed. Risks of surgery including infection, bleeding, nerve damage, death, stroke, paralysis, failure to heal, need for further surgery, ongoing or worse pain, loss of bowel or bladder control, it does not fuse, ongoing or worse pain, hardware complications were discussed.

## 2014-04-13 NOTE — Progress Notes (Signed)
SCHEDULED IV ACETAMINOPHEN:  CONVERSION TO ORAL ROUTE to complete the ordered doses.  The Pharmacy and Therapeutics Committee has restricted administration of IV acetaminophen (with a 24 hr maximum duration) to patients who meet both of the following criteria:  Unable to tolerate oral or enteral medication  Contraindication to NSAIDs  Because the patient has taken other oral medications today AFTER surgery (both a Robaxin tablet and an oxycodone tablet have been charted), IV acetaminophen has been converted to PO to complete the course of therapy originally ordered.   If PO acetaminophen should be continued beyond the original stop time, please adjust the order accordingly using the "modify" function.   If you have questions about this conversion, please contact the pharmacy department.  Brigid ReBajbus, Avyay Coger, [facility] 04/13/2014 2:21 PM

## 2014-04-13 NOTE — Brief Op Note (Signed)
04/13/2014  12:47 PM  PATIENT:  Johnathan Conrad  29 y.o. 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   POST-OPERATIVE DIAGNOSIS:  C6-7 HNP   PROCEDURE:  Procedure(s): PARTIAL C7 CORPECTOMY C6-C7 INSTRUMENTED  FUSION  (N/A)  SURGEON:  Surgeon(s) and Role:    * Venita Lickahari Vicktoria Muckey, MD - Primary  PHYSICIAN ASSISTANT:   ASSISTANTS: none   ANESTHESIA:   none  EBL:  Total I/O In: 2300 [I.V.:2300] Out: 300 [Urine:300]  BLOOD ADMINISTERED:none  DRAINS: none   LOCAL MEDICATIONS USED:  MARCAINE     SPECIMEN:  No Specimen  DISPOSITION OF SPECIMEN:  N/A  COUNTS:  YES  TOURNIQUET:  * No tourniquets in log *  DICTATION: .Other Dictation: Dictation Number [id number]  PLAN OF CARE: Admit for overnight observation  PATIENT DISPOSITION:  PACU - hemodynamically stable.

## 2014-04-13 NOTE — Transfer of Care (Signed)
Immediate Anesthesia Transfer of Care Note  Patient: Johnathan Conrad  Procedure(s) Performed: Procedure(s): PARTIAL C7 CORPECTOMY C6-C7 INSTRUMENTED  FUSION  (N/A)  Patient Location: PACU  Anesthesia Type:General  Level of Consciousness: awake, alert , oriented and patient cooperative  Airway & Oxygen Therapy: Patient Spontanous Breathing and Patient connected to nasal cannula oxygen  Post-op Assessment: Report given to PACU RN, Post -op Vital signs reviewed and stable and Patient moving all extremities X 4  Post vital signs: Reviewed and stable  Complications: No apparent anesthesia complications

## 2014-04-13 NOTE — Op Note (Signed)
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  POSTOPERATIVE DIAGNOSIS:  C6-7 disk herniation with significant migration behind the body of C7.  OPERATIVE PROCEDURE: 1. Partial C7 corpectomy with removal of disk herniation.  Anterior     cervical disk fusion with intervertebral biomechanical device and     anterior cervical plate.  COMPLICATIONS:  None.  CONDITION:  Stable.  HISTORY:  This is a very pleasant 29 year old gentleman who has been having severe C7-C8 radicular pain for the last several weeks.  Imaging studies demonstrated a large posterior left lateral disk herniation at C6-7, had migrated down to about the midportion of the C7 vertebral body.  After discussing treatment options, we elected to proceed with surgery.  I did tell him that my concern was that I would not be able to remove all of the disk herniation as an ACDF, but I may need to do either partial or complete corpectomy in order to completely remove this.  I also indicated that if we did a complete corpectomy that I would then like to do a posterior instrumented procedure to supplement the fixation.  All appropriate risks, benefits, and alternatives were discussed with the patient.  Consent was obtained.  OPERATIVE NOTE:  The patient was brought to the operating room, placed supine on the operating table.  After successful induction of general anesthesia and endotracheal intubation, TEDs, SCDs, and a Foley were inserted.  The neck was then prepped and draped in a standard fashion. Time-out was taken to confirm the patient, procedure, and all  other pertinent important data.  I then identified the C6-7 disk space interval with x-ray and then mapped out the incision centered more over the superior aspect of the C7 vertebral body.  I then infiltrated the incision site with 0.25% Marcaine and then made an incision starting at the midline proceeding to the left.  I then performed a standard Clementeen Graham approach to the anterior cervical spine.  I then dissected through the platysma.  I continued the dissection sharply deep cervical fascia.  I identified the omohyoid muscle and sacrificed it in order for better visualization.  I then swept the trachea and esophagus over to the right and dissected bluntly through the prevertebral fascia to expose the anterior longitudinal ligament.  Hemostasis was obtained and I marked the C6-7 disk space.  Intraoperative x-ray confirmed, fluoro view confirmed that I was at the appropriate level.  I then mobilized the longus colli muscles from the midbody of C6 to the midbody of C7.  I then placed self-retaining retractors underneath the longus colli muscles and deflated the endotracheal cuff and expanded them to the appropriate width and reinflated the cuff.  I then performed an annulotomy with a 15 blade scalpel and removed the bulk of the disk material with pituitary rongeurs.  I then used  a 2 and 3 mm Kerrison to trim down the overhanging osteophyte from the inferior aspect of the C6 vertebral body.  I continued my dissection using neural curettes down posteriorly.  I then placed distraction pins into the bodies of C6 and C7, distracted the intervertebral space with a lamina spreader, and then maintained the distraction with the distraction pin set.  Once I was past down posteriorly to the posterior margin of vertebral body, I then used a 1 mm Kerrison to trim down the posterior margin of the vertebral body.  I then used a high-speed bur to perform a partial corpectomy on the left side  posteriorly of C7.  This corresponded to where the disk fragment was located.  I took down significant portion of the posterolateral corner of C7.  Once this was down, I then used my 1-mm Kerrison rongeur to remove the posterior bend cortex to expose the posterior longitudinal ligament.  Then, I used a fine nerve hook to develop a plane within the disk space itself between the posterior longitudinal ligament and the thecal sac.  I used my 1-mm Kerrison to resect the posterior longitudinal ligament and expose the underlying thecal sac.  I then carried this dissection into the corpectomy channel that I had created.  At this point, I then used a series of nerve hooks to deliver 3 large fragments of disk material out into the wound and removed it with a micropituitary rongeur.  The disk herniation did correspond with the size of the disk herniation seen on MRI.  In addition, the thecal sac re-expanded, and there was no evidence of any further compression.  I then took a slightly longer nerve hook, placed it posteriorly in the corpectomy channel and then took an x-ray confirming that my nerve hook was essentially beyond the midportion of the cervical body of C7.  This demonstrated that I had adequately exposed the area where the disk herniation was.  Gently I was able to freely manipulate circumferentially the corpectomy site that nerve hook swinging it around to confirm that all fragments of disk material out. I then took a smaller thinner nerve hook and went underneath the annulus and swung it directly underneath the posterior longitudinal ligament between the PLL and the thecal sac and swung it circumferentially and into the lateral recess confirming that there was no fragmented disk that I had removed all of the disk material.  I was quite pleased with my diskectomy.  The thecal sac had re-expanded into the space.  At this point, I was pleased that I did not have to do a complete  corpectomy. There was still nuts of the endplate of C7 so my implant could rest comfortably on and he could still get a solid fusion.  I took a titanium intervertebral 8 large lordotic cage, packed it with DBX mix, and I was able to mallet it down to the appropriate depth.  I then took an anterior cervical DePuy Skyline plate 14 mm and secured it with 14-mm self-drilling screws into the bodies of 6 and 7.  All 4 screws had excellent purchase.  It should be noted that prior to putting the plate on,  I did remove the distraction pins and placed bone wax in the screw holes to prevent bleeding.  Hemostasis was obtained and the plate was affixed to the bodies of 6 and 7.  I then locked the screws in place according to manufacturer's standards.  I then copiously irrigated the wound, removed the [employer]  self-retaining and I checked these positions in esophagus to ensure it was not entrapped beneath the plate.  At this point, I was very pleased with the decompression and instrumentation. The trachea and esophagus were returned to the midline and there was no bleeding.  I then closed the platysma with interrupted 2-0 Vicryl sutures, and a 3-0 Monocryl for the skin.  Steri-Strips and a dry dressing were then applied.  The  patient was awoke and the Foley was removed since we did not to do the extensive procedure, and he was transferred to the PACU without incident.  At the end of the case, all needle and sponge counts were correct.  There was no adverse intraoperative events.     Alvy Bealahari D Tekia [place], MD     DDB/MEDQ  D:  04/13/2014  T:  04/13/2014  Job:  [id number]

## 2014-04-13 NOTE — Progress Notes (Signed)
Occupational Therapy Evaluation Patient Details Name: Johnathan BillChristopher Conrad MRN: [id number] DOB: 01/04/1986 Today's Date: 04/13/2014    History of Present Illness PARTIAL C7 CORPECTOMY C6-C7 INSTRUMENTED  FUSION   Clinical Impression   Pt states his LUE has improved significantly since surgery. Digit extension @4  to 4+/5. Grip strength 4+/5. Began educating pt/caregiver on cervical precautions and compensatory techniques for ADL and mobility. Written handout given. Need clarification regarding removing collar for shower. Plan to see pt in am to complete education regarding ADL and mobility and HEP for L hand strengthening.     Follow Up Recommendations  No OT follow up;Supervision - Intermittent    Equipment Recommendations  None recommended by OT    Recommendations for Other Services  none     Precautions / Restrictions Precautions Precautions: Cervical Required Braces or Orthoses: Cervical Brace Cervical Brace: Hard collar;Other (comment) (not specified) Restrictions Weight Bearing Restrictions: No      Mobility Bed Mobility Overal bed mobility: Needs Assistance Bed Mobility: Rolling;Sidelying to Sit Rolling: Supervision Sidelying to sit: Min guard       General bed mobility comments: vc for technique  Transfers Overall transfer level: Needs assistance   Transfers: Sit to/from Stand;Stand Pivot Transfers Sit to Stand: Min guard Stand pivot transfers: Min guard       General transfer comment: likely due to medication    Balance Overall balance assessment: Needs assistance           Standing balance-Leahy Scale: Fair                              ADL Overall ADL's : Needs assistance/impaired     Grooming: Minimal assistance   Upper Body Bathing: Minimal assitance   Lower Body Bathing: Minimal assistance;Sit to/from stand   Upper Body Dressing : Minimal assistance   Lower Body Dressing: Moderate assistance   Toilet Transfer: Minimal  assistance (simulated)   Toileting- Clothing Manipulation and Hygiene: Minimal assistance       Functional mobility during ADLs: Minimal assistance (due to feeling unsteady from medication) General ADL Comments: Began education on cervical precautions. Educated Hospital doctorpt/caregiver on bed mobility. Aspen collar not aligned. Collar adjusted appropriately. Given pt written handout.                                     Pertinent Vitals/Pain Pain Assessment: 0-10 Pain Score: 3  Pain Location: neck Pain Descriptors / Indicators: Aching     Hand Dominance Right   Extremity/Trunk Assessment Upper Extremity Assessment Upper Extremity Assessment: RUE deficits/detail;LUE deficits/detail RUE Deficits / Details: WFL LUE Deficits / Details: Pt reports sensation and strength have improved significantly. grip strength 4+/5. digit extension 4/5. thumb ext 4/5. wrist ext 4+/5 triceps 4+/5 LUE Coordination:  (functional)   Lower Extremity Assessment Lower Extremity Assessment: Overall WFL for tasks assessed   Cervical / Trunk Assessment Cervical / Trunk Assessment: Other exceptions (cervical fusion)   Communication Communication Communication: No difficulties   Cognition Arousal/Alertness: Awake/alert Behavior During Therapy: WFL for tasks assessed/performed Overall Cognitive Status: Within Functional Limits for tasks assessed                     General Comments       Exercises Exercises: Other exercises Other Exercises Other Exercises: grip strengthening ball Other Exercises:  digit extension isometrics   Shoulder Instructions  Home Living Family/patient expects to be discharged to:: Private residence Living Arrangements: Spouse/significant other Available Help at Discharge: Family;Available PRN/intermittently (caregiver will be there over the weekend) Type of Home: House Home Access: Stairs to enter [employer] of Steps: 1   Home Layout: One  level     Bathroom Shower/Tub: Tub/shower unit Shower/tub characteristics: Engineer, building services: Standard Bathroom Accessibility: Yes How Accessible: Accessible via walker Home Equipment: None          Prior Functioning/Environment Level of Independence: Independent             OT Diagnosis: Generalized weakness;Acute pain   OT Problem List: Decreased strength;Decreased range of motion;Decreased knowledge of use of DME or AE;Decreased safety awareness;Decreased knowledge of precautions;Pain   OT Treatment/Interventions: Self-care/ADL training;Therapeutic exercise;DME and/or AE instruction;Therapeutic activities;Patient/family education    OT Goals(Current goals can be found in the care plan section) Acute Rehab OT Goals Patient Stated Goal: to get my hand stronger OT Goal Formulation: With patient Time For Goal Achievement: 04/20/14 Potential to Achieve Goals: Good  OT Frequency: Min 2X/week    End of Session Equipment Utilized During Treatment: Cervical collar Nurse Communication: Mobility status;Other (comment) (need for clarification regarding collar)  Activity Tolerance: Patient tolerated treatment well Patient left: in chair;with call bell/phone within reach;with family/visitor present   Time: 1600-1620 OT Time Calculation (min): 20 min Charges:  OT General Charges $OT Visit: 1 Procedure OT Evaluation $Initial OT Evaluation Tier I: 1 Procedure G-Codes: OT G-codes **NOT FOR INPATIENT CLASS** Functional Assessment Tool Used: clinical judgement Functional Limitation: Self care Self Care Current Status (R6045): At least 20 percent but less than 40 percent impaired, limited or restricted Self Care Goal Status (W0981): At least 1 percent but less than 20 percent impaired, limited or restricted  Tahji [place],HILLARY 04/13/2014, 4:40 PM   [facility], OTR/L  [phone] 04/13/2014

## 2014-04-14 ENCOUNTER — Encounter (HOSPITAL_COMMUNITY): Payer: Self-pay | Admitting: Orthopedic Surgery

## 2014-04-14 DIAGNOSIS — M5012 Cervical disc disorder with radiculopathy, mid-cervical region: Secondary | ICD-10-CM | POA: Diagnosis not present

## 2014-04-14 NOTE — Discharge Instructions (Signed)

## 2014-04-14 NOTE — Progress Notes (Signed)
PT Cancellation Note  Patient Details Name: Johnathan Conrad MRN: [id number] DOB: 04/06/1985   Cancelled Treatment:    Reason Eval/Treat Not Completed: PT screened, no needs identified, will sign off. Spoke with OT, pt mobilizing well. No acute PT needs warranted.    Donnamarie [place], Duell Holdren Igo[place], [place]PT  [phone] 04/14/2014, 9:39 AM

## 2014-04-14 NOTE — Anesthesia Postprocedure Evaluation (Signed)
Anesthesia Post Note  Patient: Johnathan Conrad  Procedure(s) Performed: Procedure(s) (LRB): PARTIAL C7 CORPECTOMY C6-C7 INSTRUMENTED  FUSION  (N/A)  Anesthesia type: General  Patient location: PACU  Post pain: Pain level controlled  Post assessment: Post-op Vital signs reviewed  Last Vitals: BP 129/72 mmHg  Pulse 101  Temp(Src) 36.7 C (Oral)  Resp 18  Ht 5\' 9"  (1.753 m)  Wt 261 lb (118.389 kg)  BMI 38.53 kg/m2  SpO2 96%  Post vital signs: Reviewed  Level of consciousness: sedated  Complications: No apparent anesthesia complications

## 2014-04-14 NOTE — Plan of Care (Signed)
Problem: Consults Goal: Diagnosis - Spinal Surgery Outcome: Completed/Met Date Met:  04/14/14 Cervical Spine Fusion PARTIAL C7 CORPECTOMY C6-C7 INSTRUMENTED  FUSION (N/A)

## 2014-04-14 NOTE — Progress Notes (Signed)
Occupational Therapy Evaluation Patient Details Name: Johnathan Conrad MRN: [id number] DOB: 1986-02-09 Today's Date: 04/14/2014    History of Present Illness PARTIAL C7 CORPECTOMY C6-C7 INSTRUMENTED  FUSION   Clinical Impression   Completed all education regarding cervical precautions, ADL, management of Aspen collar and mobility. Also educated pt on strengthening ex for L hand. Written HEP given. Pt to follow up with Dr. Shon Baton to progress any further therapy needs. Pt ready for D/C.    Follow Up Recommendations  No OT follow up;Supervision - Intermittent    Equipment Recommendations  None recommended by OT    Recommendations for Other Services       Precautions / Restrictions Precautions Precautions: Cervical Required Braces or Orthoses: Cervical Brace Cervical Brace: Hard collar;Other (comment) (off for bathing/dressing; off when sleeping or when upright ) Restrictions Weight Bearing Restrictions: No      Mobility Bed Mobility Overal bed mobility: Modified Independent                Transfers Overall transfer level: Modified independent                    Balance Overall balance assessment: No apparent balance deficits (not formally assessed)                                          ADL Overall ADL's : Needs assistance/impaired     Grooming: Set up;Cueing for compensatory techniques   Upper Body Bathing: Set up;Cueing for compensatory techniques   Lower Body Bathing: Supervison/ safety;Set up;Cueing for compensatory techniques   Upper Body Dressing : Supervision/safety;Set up;Cueing for compensatory techniques   Lower Body Dressing: Set up;Supervision/safety;Cueing for compensatory techniques   Toilet Transfer: Modified Independent   Toileting- Clothing Manipulation and Hygiene: Supervision/safety;Sit to/from stand       Functional mobility during ADLs: Modified independent General ADL Comments: Completed education  regarding cervical precautions and compensatory techniques. pt/caregiver able to return demonstrate. caregiver able to donn/doff Aspen collar.      Vision                     Perception     Praxis      Pertinent Vitals/Pain Pain Assessment: 0-10 Pain Score: 3  Pain Location: neck Pain Descriptors / Indicators: Aching Pain Intervention(s): Monitored during session;Limited activity within patient's tolerance     Hand Dominance     Extremity/Trunk Assessment             Communication     Cognition Arousal/Alertness: Awake/alert Behavior During Therapy: WFL for tasks assessed/performed Overall Cognitive Status: Within Functional Limits for tasks assessed                     General Comments       Exercises Exercises: Other exercises Other Exercises Other Exercises: grip strengthening ball Other Exercises:  digit extension isometrics Other Exercises: theraputty - med for digit and thumb ext; abduction                    OT Diagnosis:  generalized weakness; pain   OT Problem List:     OT Treatment/Interventions:   HEP; compensatory techniques for ADL   OT Goals(Current goals can be found in the care plan section) Acute Rehab OT Goals Patient Stated Goal: to get my hand stronger OT Goal Formulation: With patient Time  For Goal Achievement: 04/20/14 Potential to Achieve Goals: Good ADL Goals Pt/caregiver will Perform Home Exercise Program: With theraputty;Left upper extremity;With written HEP provided Additional ADL Goal #1: pt/caregiver will demonstrate understanding of use of cervical precautions for ADL and mobility.  OT Frequency: Min 2X/week   Barriers to D/C:            Co-evaluation              End of Session Equipment Utilized During Treatment: Cervical collar Nurse Communication: Mobility status;Other (comment) (ready for D/C)  Activity Tolerance: Patient tolerated treatment well Patient left: in chair;with call  bell/phone within reach;with family/visitor present   Time: 0922-0958 OT Time Calculation (min): 36 min Charges:  OT General Charges $OT Visit: 1 Procedure OT Treatments $Self Care/Home Management : 8-22 mins $Therapeutic Activity: 8-22 mins G-Codes: OT G-codes **NOT FOR INPATIENT CLASS** Functional Assessment Tool Used: clinical judgement Self Care Current Status (A5409(G8987): At least 1 percent but less than 20 percent impaired, limited or restricted Self Care Goal Status (W1191(G8988): At least 1 percent but less than 20 percent impaired, limited or restricted  [facility] CenterWARD,HILLARY 04/14/2014, 10:16 AM   [facility] Centeril[facility] Marko Skalski, OTR/L  [phone] 04/14/2014

## 2014-04-14 NOTE — Discharge Summary (Signed)
Patient ID: Johnathan Conrad MRN: [id number] DOB/AGE: [birth year] 28 y.o.  Admit date: 04/13/2014 Discharge date: 04/14/2014  Admission Diagnoses:  Active Problems:   Cervical disc disorder with radiculopathy   Discharge Diagnoses:  Active Problems:   Cervical disc disorder with radiculopathy  status post Procedure(s): PARTIAL C7 CORPECTOMY C6-C7 INSTRUMENTED  FUSION   Past Medical History  Diagnosis Date  . Hypertension     not on treatment; does not have a medical doctor  . Restless leg   . Cervical herniated disc   . Shortness of breath dyspnea     since stopped smoking; intermittent    Surgeries: Procedure(s): PARTIAL C7 CORPECTOMY C6-C7 INSTRUMENTED  FUSION  on 04/13/2014   Consultants:    Discharged Condition: Improved  Hospital Course: Johnathan Conrad is an 29 y.o. male who was admitted 04/13/2014 for operative treatment of cervical disc herniation with radiculopathy. Patient failed conservative treatments (please see the history and physical for the specifics) and had severe unremitting pain that affects sleep, daily activities and work/hobbies. After pre-op clearance, the patient was taken to the operating room on 04/13/2014 and underwent  Procedure(s): PARTIAL C7 CORPECTOMY C6-C7 INSTRUMENTED  FUSION .    Patient was given perioperative antibiotics: Anti-infectives    Start     Dose/Rate Route Frequency Ordered Stop   04/13/14 1800  ceFAZolin (ANCEF) IVPB 1 g/50 mL premix     1 g100 mL/hr over 30 Minutes Intravenous 4 times per day 04/13/14 1511 04/14/14 0049   04/13/14 0829  vancomycin (VANCOCIN) 1 GM/200ML IVPB    Comments:  Jimmye Norman   : cabinet override      04/13/14 0829 04/13/14 2044   04/12/14 1307  vancomycin (VANCOCIN) 1,500 mg in sodium chloride 0.9 % 500 mL IVPB     1,500 mg250 mL/hr over 120 Minutes Intravenous 120 min pre-op 04/12/14 1307 04/13/14 0935       Patient was given sequential compression devices and early ambulation to  prevent DVT.   Patient benefited maximally from hospital stay and there were no complications. At the time of discharge, the patient was urinating/moving their bowels without difficulty, tolerating a regular diet, pain is controlled with oral pain medications and they have been cleared by PT/OT.   Recent vital signs: Patient Vitals for the past 24 hrs:  BP Temp Temp src Pulse Resp SpO2  04/14/14 0508 129/72 mmHg 98 F (36.7 C) Oral (!) 101 18 96 %  04/14/14 0022 127/76 mmHg 98.7 F (37.1 C) Oral (!) 109 18 96 %  04/13/14 2007 (!) 141/93 mmHg 98.5 F (36.9 C) Oral (!) 109 18 96 %  04/13/14 1809 (!) 149/94 mmHg 98.5 F (36.9 C) - (!) 104 18 97 %  04/13/14 1615 (!) 146/89 mmHg 98.7 F (37.1 C) - (!) 102 18 96 %  04/13/14 1413 130/86 mmHg 98 F (36.7 C) - 96 18 96 %  04/13/14 1330 133/84 mmHg 98 F (36.7 C) - 81 19 96 %  04/13/14 1315 - - - 80 15 91 %  04/13/14 1302 - 98 F (36.7 C) - - - 99 %     Recent laboratory studies: No results for input(s): WBC, HGB, HCT, PLT, NA, K, CL, CO2, BUN, CREATININE, GLUCOSE, INR, CALCIUM in the last 72 hours.  Invalid input(s): PT, 2   Discharge Medications:     Medication List    STOP taking these medications        ibuprofen 800 MG tablet  Commonly known  as:  ADVIL,MOTRIN     oxyCODONE-acetaminophen 5-325 MG per tablet  Commonly known as:  PERCOCET/ROXICET  Replaced by:  oxyCODONE-acetaminophen 10-325 MG per tablet      TAKE these medications        docusate sodium 100 MG capsule  Commonly known as:  COLACE  Take 1 capsule (100 mg total) by mouth 3 (three) times daily as needed for mild constipation.     methocarbamol 500 MG tablet  Commonly known as:  ROBAXIN  Take 1 tablet (500 mg total) by mouth 3 (three) times daily as needed for muscle spasms.     ondansetron 4 MG tablet  Commonly known as:  ZOFRAN  Take 1 tablet (4 mg total) by mouth every 8 (eight) hours as needed for nausea or vomiting.     oxyCODONE-acetaminophen  10-325 MG per tablet  Commonly known as:  PERCOCET  Take 1 tablet by mouth every 4 (four) hours as needed for pain.        Diagnostic Studies: Dg Chest 2 View  04/10/2014   CLINICAL DATA:  Herniated disc.  EXAM: CHEST  2 VIEW  COMPARISON:  None.  FINDINGS: Mediastinum hilar structures are normal. Bibasilar subsegmental atelectasis. No pleural effusion or pneumothorax. Heart size normal .  IMPRESSION: Bibasilar subsegmental atelectasis.  Exam otherwise unremarkable.   Electronically Signed   By: Maisie Fus  Register   On: 04/10/2014 10:41   Dg Cervical Spine 2 Or 3 Views  04/13/2014   CLINICAL DATA:  29 year old male post spinal fusion. Subsequent encounter.  EXAM: CERVICAL SPINE - 2-3 VIEW  COMPARISON:  Intraoperative C-arm views 04/13/2014.  FINDINGS: C6-7 surgery with interbody spacer with anterior plate and screws in place. The inferior aspect of the fusion is poorly delineated secondary patient's shoulders.  Gas seen in the prevertebral region most likely related to the recent post operative state.  Mild impression upon the posterior aspect of the trachea at the operative level may reflect expected changes of postoperative edema although hemorrhage not excluded. Stability can be confirmed on follow-up. If further delineation were clinically desired at the present time, CT can be obtained.  IMPRESSION: Fusion C6-7 with inferior aspect poorly delineated secondary to overlying shoulders.  Prevertebral soft tissue prominence at the operative site may represent expected postoperative edema. Hemorrhage not entirely excluded and if of clinical concern CT imaging can be performed.   Electronically Signed   By: Bridgett Larsson M.D.   On: 04/13/2014 13:52   Dg Cervical Spine Complete  04/13/2014   CLINICAL DATA:  Partial C7 corpectomy and C6-7 discectomy and instrumentation.  EXAM: DG C-ARM 61-120 MIN; CERVICAL SPINE - COMPLETE 4+ VIEW  : COMPARISON:  None available  FINDINGS: A series of 5 fluoroscopic spot images  document instrumented ACDF C6-7, hardware and intervertebral cage projecting in expected location.  IMPRESSION: ACDF C6-7   Electronically Signed   By: Oley Balm M.D.   On: 04/13/2014 13:06   Dg C-arm 61-120 Min  04/13/2014   CLINICAL DATA:  Partial C7 corpectomy and C6-7 discectomy and instrumentation.  EXAM: DG C-ARM 61-120 MIN; CERVICAL SPINE - COMPLETE 4+ VIEW  : COMPARISON:  None available  FINDINGS: A series of 5 fluoroscopic spot images document instrumented ACDF C6-7, hardware and intervertebral cage projecting in expected location.  IMPRESSION: ACDF C6-7   Electronically Signed   By: Oley Balm M.D.   On: 04/13/2014 13:06        Discharge Instructions    Call MD / Call 911  Complete by:  As directed   If you experience chest pain or shortness of breath, CALL 911 and be transported to the hospital emergency room.  If you develope a fever above 101 F, pus (white drainage) or increased drainage or redness at the wound, or calf pain, call your surgeon's office.           Follow-up Information    Follow up with Destiny Hagin D, MD. Schedule an appointment as soon as possible for a visit in 2 weeks.   Specialty:  Orthopedic [facility]   Why:  For suture removal, For wound re-check   Contact information:   [address] [place] [id number] [phone]       Discharge Plan:  discharge to home  Disposition: f/u in 2 weeks    Signed: Venita LickBROOKS,Ylonda Storr D for Dr. Venita Lickahari [facility] [phone]) [phone] 04/14/2014, 10:59 AM

## 2014-04-14 NOTE — Progress Notes (Signed)
    Subjective: Procedure(s) (LRB): PARTIAL C7 CORPECTOMY C6-C7 INSTRUMENTED  FUSION  (N/A) 1 Day Post-Op  Patient reports pain as 2 on 0-10 scale.  Reports decreased arm pain reports incisional neck pain   Positive void Negative bowel movement Positive flatus Negative chest pain or shortness of breath  Objective: Vital signs in last 24 hours: Temp:  [98 F (36.7 C)-98.7 F (37.1 C)] 98 F (36.7 C) (01/22 0508) Pulse Rate:  [80-109] 101 (01/22 0508) Resp:  [15-19] 18 (01/22 0508) BP: (127-149)/(72-94) 129/72 mmHg (01/22 0508) SpO2:  [91 %-99 %] 96 % (01/22 0508)  Intake/Output from previous day: 01/21 0701 - 01/22 0700 In: 2590 [P.O.:240; I.V.:2300; IV Piggyback:50] Out: 600 [Urine:600]  Labs: No results for input(s): WBC, RBC, HCT, PLT in the last 72 hours. No results for input(s): NA, K, CL, CO2, BUN, CREATININE, GLUCOSE, CALCIUM in the last 72 hours. No results for input(s): LABPT, INR in the last 72 hours.  Physical Exam: Neurologically intact ABD soft Neurovascular intact Intact pulses distally Incision: dressing C/D/I Compartment soft  Assessment/Plan: Patient stable  xrays satisfactory Mobilization with physical therapy Encourage incentive spirometry Continue care  Advance diet Up with therapy  Doing well Ok for d/c to home  F/u 2 weeks  Venita Lickahari Zoeann Mol, MD [facility] [phone]) [phone]

## 2014-04-16 NOTE — Progress Notes (Signed)
04/16/2014 1450 No NCM needs identified. Isidoro DonningAlesia Marsi Turvey RN CCM Case Mgmt phone [phone]

## 2015-07-24 IMAGING — CR DG CHEST 2V
2 series · 2 of 2 positions shown · non-contrast
Comparison: None.

CLINICAL DATA: Herniated disc.

EXAM:
CHEST  2 VIEW

[w chest pa]
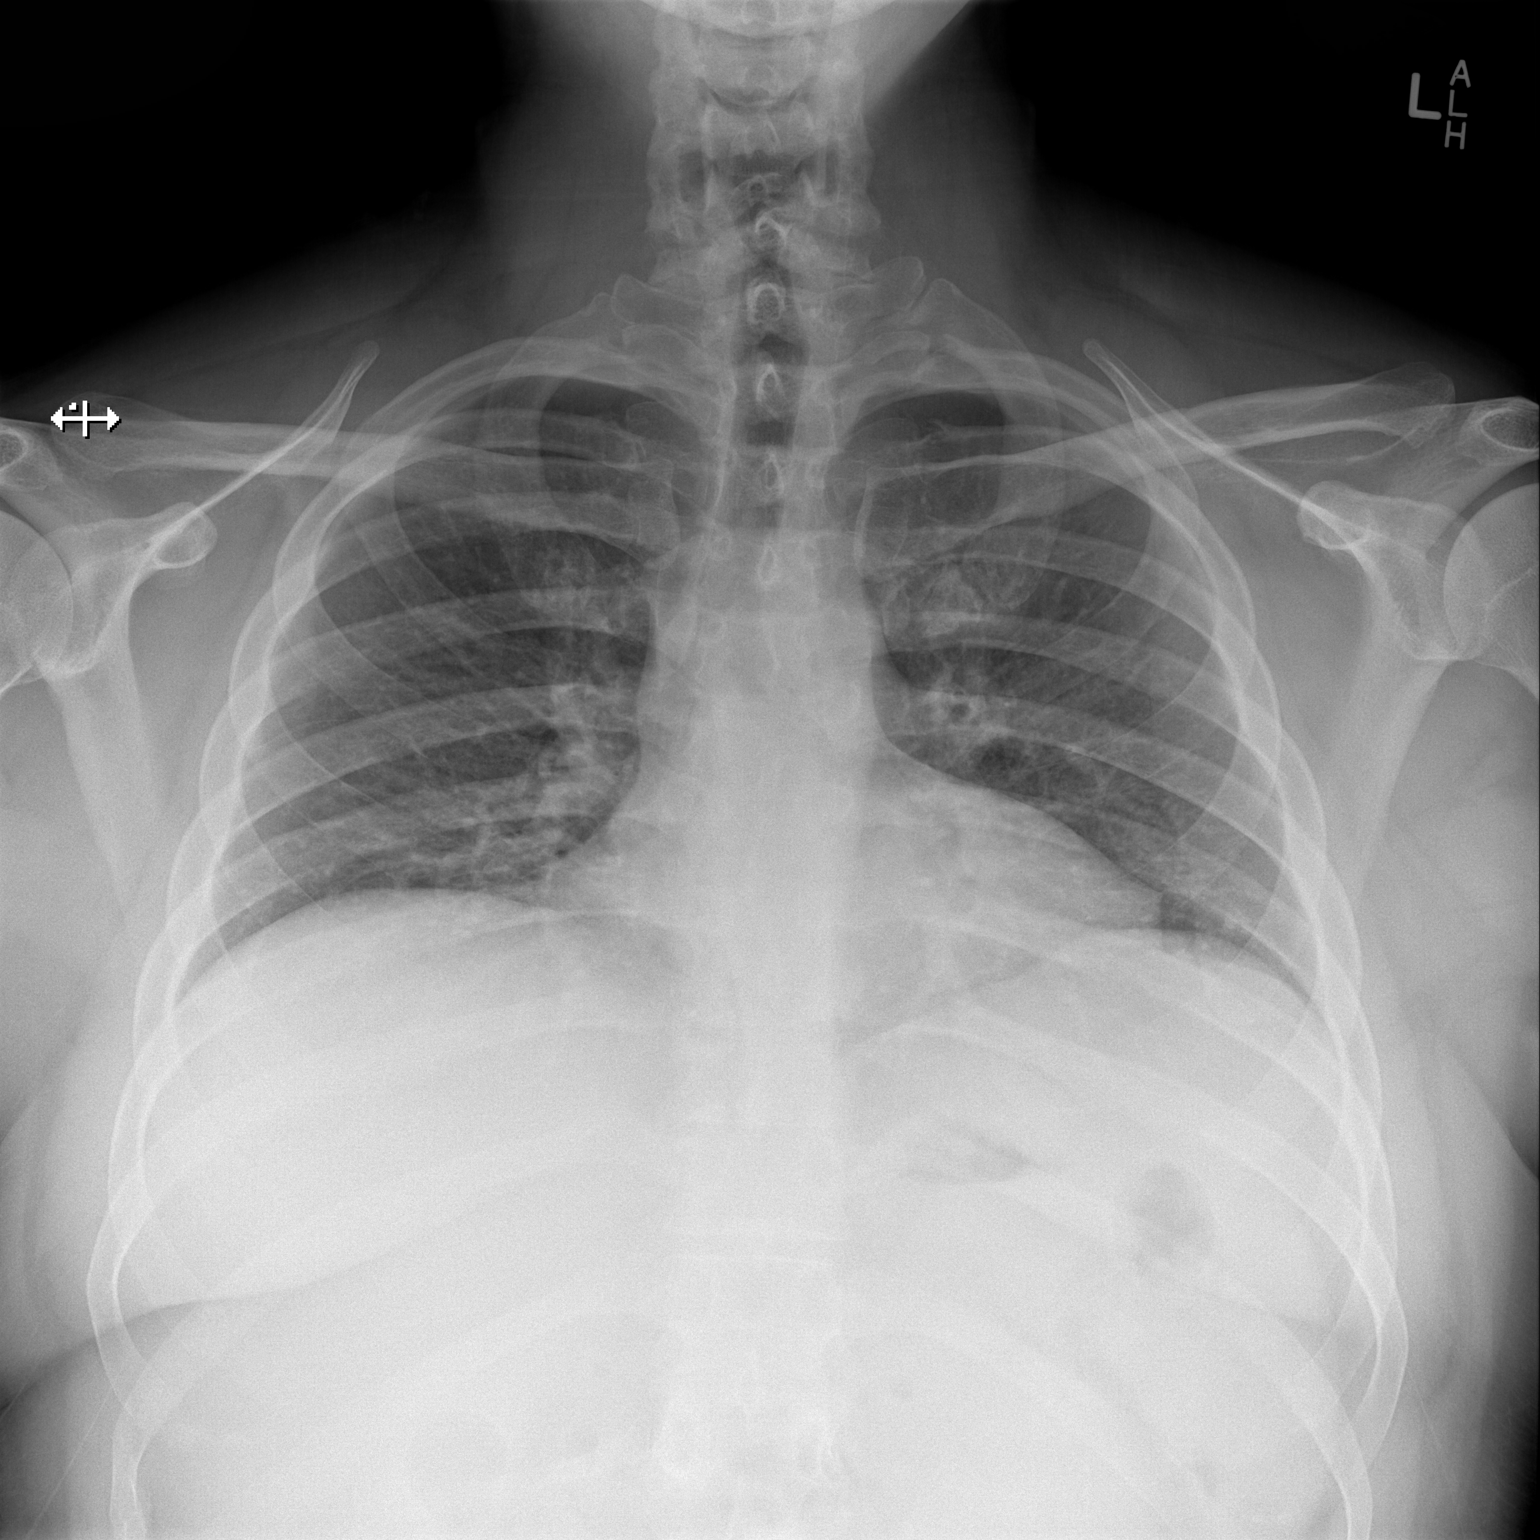

[w chest lat]
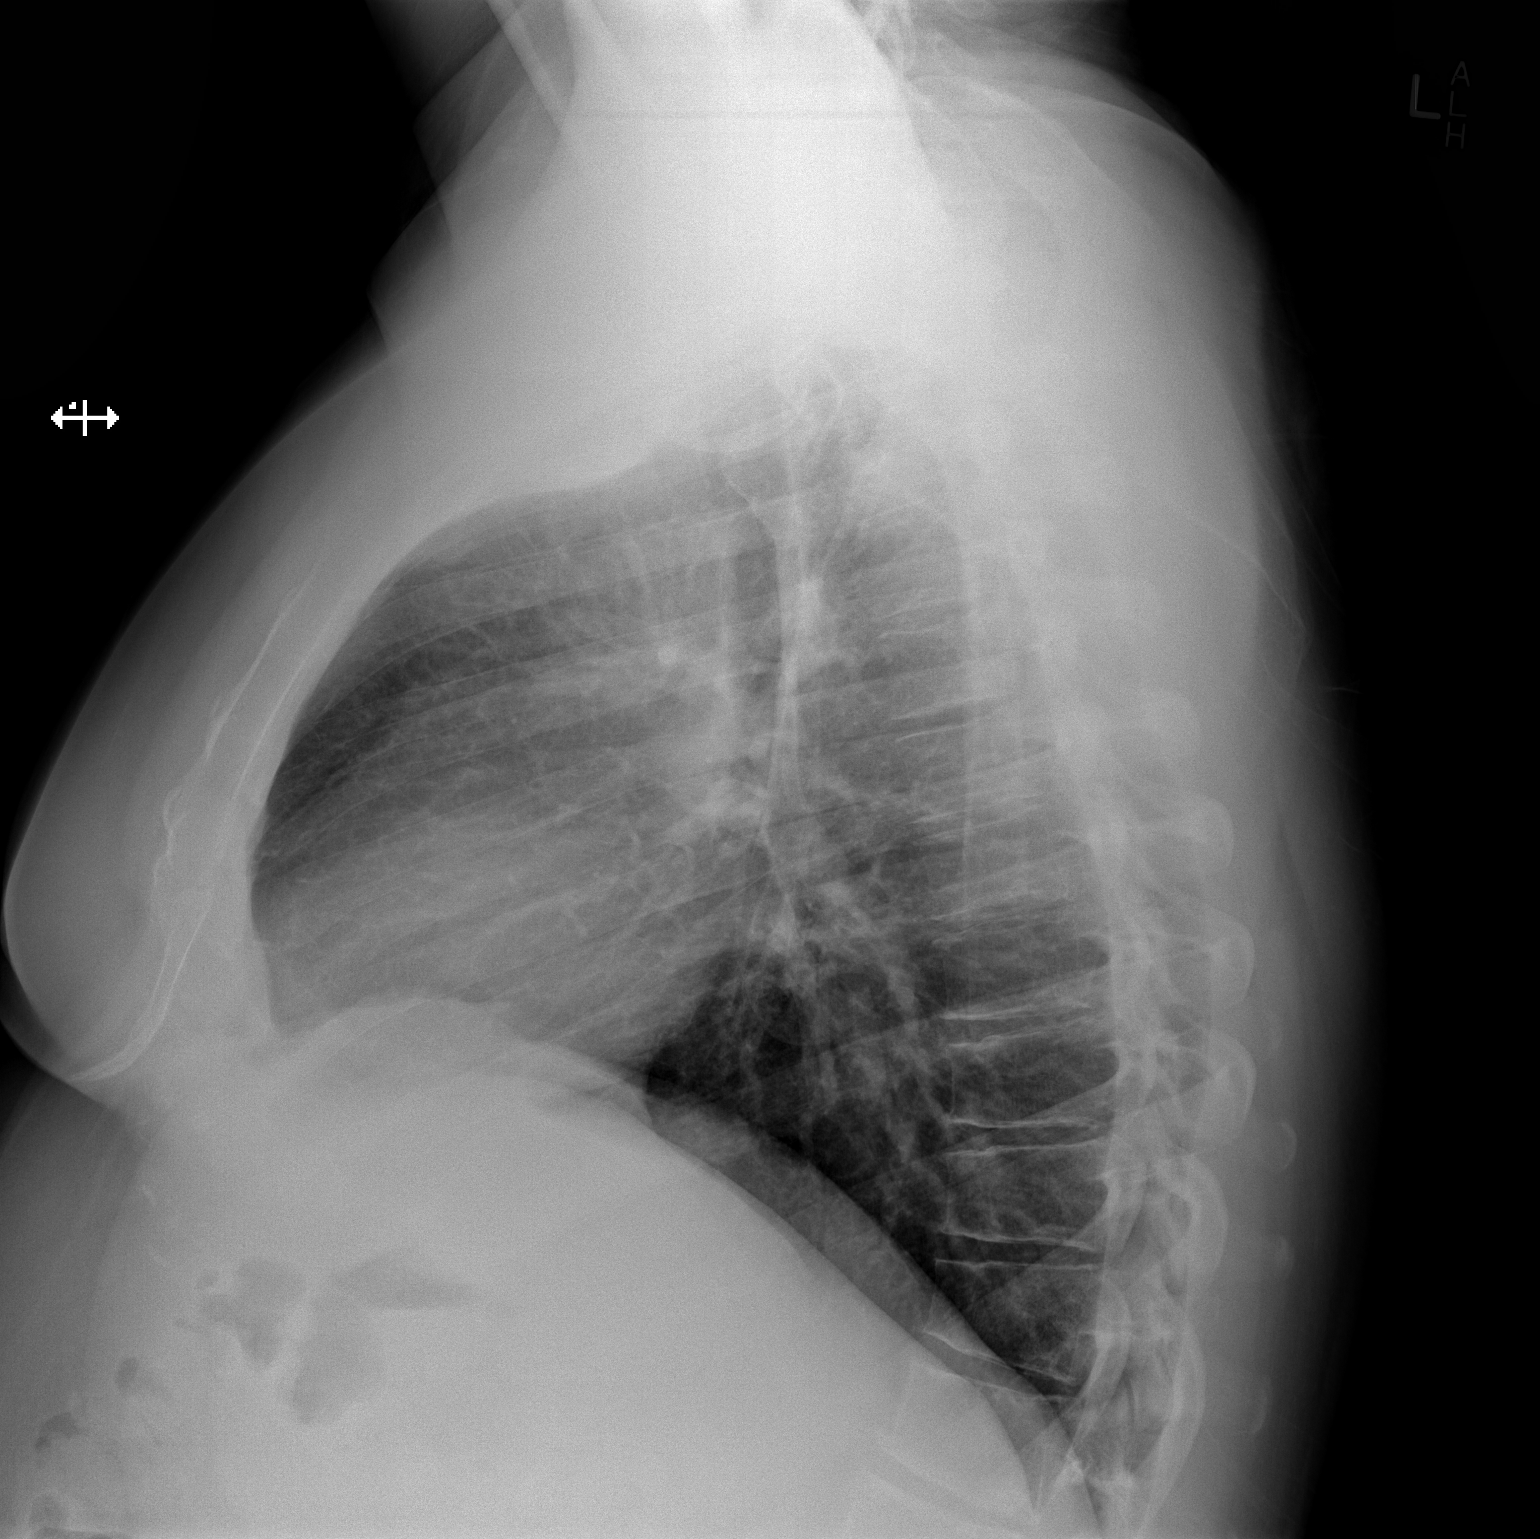

[2 of 2 positions shown; findings below may reference images not displayed]

FINDINGS: Mediastinum hilar structures are normal. Bibasilar subsegmental
atelectasis. No pleural effusion or pneumothorax. Heart size normal
.
IMPRESSION: Bibasilar subsegmental atelectasis.  Exam otherwise unremarkable.

## 2018-01-05 ENCOUNTER — Other Ambulatory Visit
Admit: 2018-01-05 | Discharge: 2018-01-05 | Disposition: A | Discharge status: Home / Self Care | Payer: Worker's Compensation | Attending: Emergency Medicine | Admitting: Emergency Medicine
